# Patient Record
Sex: Male | Born: 2000 | Race: Black or African American | Hispanic: No | Marital: Single | Smoking: Never smoker
Health system: Southern US, Community
[De-identification: ages and names within clinical notes are randomized; demographics above are authoritative.]

---

## 2018-10-23 ENCOUNTER — Emergency Department (HOSPITAL_COMMUNITY): Payer: Medicaid Other

## 2018-10-23 ENCOUNTER — Other Ambulatory Visit: Payer: Self-pay

## 2018-10-23 ENCOUNTER — Encounter (HOSPITAL_COMMUNITY): Payer: Self-pay

## 2018-10-23 ENCOUNTER — Emergency Department (HOSPITAL_COMMUNITY)
Admission: EM | Admit: 2018-10-23 | Discharge: 2018-10-23 | Disposition: A | Discharge status: Home / Self Care | Payer: Medicaid Other | Attending: Emergency Medicine | Admitting: Emergency Medicine

## 2018-10-23 DIAGNOSIS — M545 Low back pain, unspecified: Secondary | ICD-10-CM

## 2018-10-23 DIAGNOSIS — M542 Cervicalgia: Secondary | ICD-10-CM | POA: Insufficient documentation

## 2018-10-23 DIAGNOSIS — M419 Scoliosis, unspecified: Secondary | ICD-10-CM | POA: Diagnosis not present

## 2018-10-23 DIAGNOSIS — Y999 Unspecified external cause status: Secondary | ICD-10-CM | POA: Insufficient documentation

## 2018-10-23 DIAGNOSIS — Y9389 Activity, other specified: Secondary | ICD-10-CM | POA: Insufficient documentation

## 2018-10-23 DIAGNOSIS — Y92410 Unspecified street and highway as the place of occurrence of the external cause: Secondary | ICD-10-CM | POA: Insufficient documentation

## 2018-10-23 DIAGNOSIS — R0789 Other chest pain: Secondary | ICD-10-CM | POA: Insufficient documentation

## 2018-10-23 MED ORDER — METHOCARBAMOL 500 MG PO TABS: 500.0000 mg | ORAL_TABLET | Freq: Two times a day (BID) | ORAL | 0 refills | Status: AC

## 2018-10-23 MED ORDER — NAPROXEN 500 MG PO TABS
500.0000 mg | ORAL_TABLET | Freq: Once | ORAL | Status: AC
  Administered 2018-10-23: 500 mg via ORAL
  Filled 2018-10-23: qty 1

## 2018-10-23 MED ORDER — NAPROXEN 500 MG PO TABS: 500.0000 mg | ORAL_TABLET | Freq: Two times a day (BID) | ORAL | 0 refills | Status: AC

## 2018-10-23 NOTE — Discharge Instructions (Addendum)
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
## 2018-11-11 ENCOUNTER — Other Ambulatory Visit: Payer: Self-pay

## 2018-11-11 ENCOUNTER — Ambulatory Visit: Payer: Medicaid Other | Attending: Orthopedic Surgery

## 2018-11-11 DIAGNOSIS — M542 Cervicalgia: Secondary | ICD-10-CM | POA: Insufficient documentation

## 2018-11-11 DIAGNOSIS — M62830 Muscle spasm of back: Secondary | ICD-10-CM | POA: Diagnosis not present

## 2018-11-11 DIAGNOSIS — M545 Low back pain, unspecified: Secondary | ICD-10-CM

## 2018-11-11 DIAGNOSIS — M4122 Other idiopathic scoliosis, cervical region: Secondary | ICD-10-CM | POA: Insufficient documentation
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
## 2018-11-17 ENCOUNTER — Other Ambulatory Visit: Payer: Self-pay

## 2018-11-17 ENCOUNTER — Encounter: Payer: Self-pay | Admitting: Physical Therapy

## 2018-11-17 ENCOUNTER — Ambulatory Visit: Payer: Medicaid Other | Admitting: Physical Therapy

## 2018-11-17 DIAGNOSIS — M62830 Muscle spasm of back: Secondary | ICD-10-CM

## 2018-11-17 DIAGNOSIS — M545 Low back pain, unspecified: Secondary | ICD-10-CM

## 2018-11-17 DIAGNOSIS — M4122 Other idiopathic scoliosis, cervical region: Secondary | ICD-10-CM

## 2018-11-17 DIAGNOSIS — M542 Cervicalgia: Secondary | ICD-10-CM
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
## 2018-11-25 ENCOUNTER — Ambulatory Visit: Payer: Medicaid Other

## 2018-11-25 ENCOUNTER — Other Ambulatory Visit: Payer: Self-pay

## 2018-11-25 DIAGNOSIS — M545 Low back pain, unspecified: Secondary | ICD-10-CM

## 2018-11-25 DIAGNOSIS — M4122 Other idiopathic scoliosis, cervical region: Secondary | ICD-10-CM

## 2018-11-25 DIAGNOSIS — M542 Cervicalgia: Secondary | ICD-10-CM
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
## 2018-11-25 NOTE — Patient Instructions (Signed)
Deep breathing into Lt lower lungs to stretch ribs/spine in prone , RT sidelye and  Standing with hold and lean back 5-10 deep breaths daily

## 2018-11-27 ENCOUNTER — Telehealth: Payer: Self-pay | Admitting: Physical Therapy

## 2018-11-27 NOTE — Telephone Encounter (Signed)
Spoke with Ruben Thompson's mother about his progress and schedule. And that we will see [name] untill the end of the plan if needed and discuss at that time if more PT is needed.

## 2018-12-02 ENCOUNTER — Other Ambulatory Visit: Payer: Self-pay

## 2018-12-02 ENCOUNTER — Ambulatory Visit: Payer: Medicaid Other | Attending: Orthopedic Surgery | Admitting: Physical Therapy

## 2018-12-02 ENCOUNTER — Encounter: Payer: Self-pay | Admitting: Physical Therapy

## 2018-12-02 DIAGNOSIS — M542 Cervicalgia: Secondary | ICD-10-CM | POA: Diagnosis present

## 2018-12-02 DIAGNOSIS — M545 Low back pain, unspecified: Secondary | ICD-10-CM

## 2018-12-02 DIAGNOSIS — M4122 Other idiopathic scoliosis, cervical region: Secondary | ICD-10-CM | POA: Diagnosis present

## 2018-12-02 DIAGNOSIS — M62830 Muscle spasm of back: Secondary | ICD-10-CM | POA: Insufficient documentation
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
## 2018-12-09 ENCOUNTER — Other Ambulatory Visit: Payer: Self-pay

## 2018-12-09 ENCOUNTER — Ambulatory Visit: Payer: Medicaid Other

## 2018-12-09 DIAGNOSIS — M545 Low back pain, unspecified: Secondary | ICD-10-CM

## 2018-12-09 DIAGNOSIS — M542 Cervicalgia: Secondary | ICD-10-CM

## 2018-12-09 DIAGNOSIS — M4122 Other idiopathic scoliosis, cervical region: Secondary | ICD-10-CM
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
## 2018-12-16 ENCOUNTER — Ambulatory Visit: Payer: Medicaid Other

## 2018-12-16 ENCOUNTER — Other Ambulatory Visit: Payer: Self-pay

## 2018-12-16 DIAGNOSIS — M62830 Muscle spasm of back: Secondary | ICD-10-CM

## 2018-12-16 DIAGNOSIS — M4122 Other idiopathic scoliosis, cervical region: Secondary | ICD-10-CM

## 2018-12-16 DIAGNOSIS — M545 Low back pain, unspecified: Secondary | ICD-10-CM
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
## 2018-12-23 ENCOUNTER — Ambulatory Visit: Payer: Medicaid Other

## 2019-02-02 ENCOUNTER — Telehealth: Payer: Self-pay | Admitting: Physical Therapy

## 2019-02-02 NOTE — Telephone Encounter (Signed)
No call was made.
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
